# Patient Record
Sex: Female | Born: 1975 | Race: Black or African American | Hispanic: No | Marital: Married | Smoking: Never smoker
Health system: Southern US, Community
[De-identification: ages and names within clinical notes are randomized; demographics above are authoritative.]

## PROBLEM LIST (undated history)

## (undated) DIAGNOSIS — E119 Type 2 diabetes mellitus without complications: Secondary | ICD-10-CM

## (undated) DIAGNOSIS — I10 Essential (primary) hypertension: Secondary | ICD-10-CM

## (undated) HISTORY — PX: OTHER SURGICAL HISTORY: SHX169

## (undated) HISTORY — PX: ROBOT ASSISTED MYOMECTOMY: SHX5142

---

## 1999-01-15 ENCOUNTER — Other Ambulatory Visit: Admission: RE | Admit: 1999-01-15 | Discharge: 1999-01-15 | Payer: Self-pay | Admitting: Obstetrics

## 1999-11-22 ENCOUNTER — Other Ambulatory Visit: Admission: RE | Admit: 1999-11-22 | Discharge: 1999-11-22 | Payer: Self-pay | Admitting: Obstetrics

## 2017-09-08 ENCOUNTER — Other Ambulatory Visit: Payer: Self-pay

## 2017-09-08 ENCOUNTER — Encounter (HOSPITAL_BASED_OUTPATIENT_CLINIC_OR_DEPARTMENT_OTHER): Payer: Self-pay | Admitting: Emergency Medicine

## 2017-09-08 ENCOUNTER — Encounter (HOSPITAL_COMMUNITY): Payer: Self-pay

## 2017-09-08 ENCOUNTER — Emergency Department (HOSPITAL_BASED_OUTPATIENT_CLINIC_OR_DEPARTMENT_OTHER)
Admission: EM | Admit: 2017-09-08 | Discharge: 2017-09-08 | Disposition: A | Discharge status: Home / Self Care | Payer: Managed Care, Other (non HMO) | Attending: Emergency Medicine | Admitting: Emergency Medicine

## 2017-09-08 ENCOUNTER — Emergency Department (HOSPITAL_BASED_OUTPATIENT_CLINIC_OR_DEPARTMENT_OTHER): Payer: Managed Care, Other (non HMO)

## 2017-09-08 ENCOUNTER — Emergency Department (HOSPITAL_COMMUNITY)
Admission: EM | Admit: 2017-09-08 | Discharge: 2017-09-09 | Disposition: A | Discharge status: Home / Self Care | Payer: Managed Care, Other (non HMO) | Source: Home / Self Care | Attending: Emergency Medicine | Admitting: Emergency Medicine

## 2017-09-08 DIAGNOSIS — E876 Hypokalemia: Secondary | ICD-10-CM | POA: Insufficient documentation

## 2017-09-08 DIAGNOSIS — K529 Noninfective gastroenteritis and colitis, unspecified: Secondary | ICD-10-CM

## 2017-09-08 DIAGNOSIS — R1013 Epigastric pain: Secondary | ICD-10-CM | POA: Diagnosis present

## 2017-09-08 DIAGNOSIS — E119 Type 2 diabetes mellitus without complications: Secondary | ICD-10-CM | POA: Insufficient documentation

## 2017-09-08 DIAGNOSIS — R1084 Generalized abdominal pain: Secondary | ICD-10-CM

## 2017-09-08 DIAGNOSIS — Z79899 Other long term (current) drug therapy: Secondary | ICD-10-CM

## 2017-09-08 DIAGNOSIS — I10 Essential (primary) hypertension: Secondary | ICD-10-CM | POA: Diagnosis not present

## 2017-09-08 DIAGNOSIS — R109 Unspecified abdominal pain: Secondary | ICD-10-CM

## 2017-09-08 HISTORY — DX: Essential (primary) hypertension: I10

## 2017-09-08 HISTORY — DX: Type 2 diabetes mellitus without complications: E11.9

## 2017-09-08 LAB — PREGNANCY, URINE: Preg Test, Ur: NEGATIVE

## 2017-09-08 LAB — CBG MONITORING, ED: GLUCOSE-CAPILLARY: 156 mg/dL — AB (ref 70–99)

## 2017-09-08 LAB — COMPREHENSIVE METABOLIC PANEL
ALBUMIN: 4.4 g/dL (ref 3.5–5.0)
ALK PHOS: 62 U/L (ref 38–126)
ALT: 13 U/L (ref 0–44)
ALT: 13 U/L (ref 0–44)
AST: 14 U/L — AB (ref 15–41)
AST: 13 U/L — ABNORMAL LOW (ref 15–41)
Albumin: 4.1 g/dL (ref 3.5–5.0)
Alkaline Phosphatase: 59 U/L (ref 38–126)
Anion gap: 8 (ref 5–15)
Anion gap: 10 (ref 5–15)
BILIRUBIN TOTAL: 0.6 mg/dL (ref 0.3–1.2)
BUN: 11 mg/dL (ref 6–20)
BUN: 8 mg/dL (ref 6–20)
CALCIUM: 8.6 mg/dL — AB (ref 8.9–10.3)
CHLORIDE: 102 mmol/L (ref 98–111)
CO2: 26 mmol/L (ref 22–32)
CO2: 24 mmol/L (ref 22–32)
CREATININE: 0.55 mg/dL (ref 0.44–1.00)
Calcium: 8.9 mg/dL (ref 8.9–10.3)
Chloride: 101 mmol/L (ref 98–111)
Creatinine, Ser: 0.54 mg/dL (ref 0.44–1.00)
GFR calc Af Amer: >60 mL/min (ref >60)
GLUCOSE: 256 mg/dL — AB (ref 70–99)
Glucose, Bld: 148 mg/dL — ABNORMAL HIGH (ref 70–99)
POTASSIUM: 3.1 mmol/L — AB (ref 3.5–5.1)
Potassium: 2.5 mmol/L — CL (ref 3.5–5.1)
Sodium: 135 mmol/L (ref 135–145)
Sodium: 136 mmol/L (ref 135–145)
TOTAL PROTEIN: 7.9 g/dL (ref 6.5–8.1)
Total Bilirubin: 0.8 mg/dL (ref 0.3–1.2)
Total Protein: 7.3 g/dL (ref 6.5–8.1)

## 2017-09-08 LAB — CBC
HEMATOCRIT: 45.5 % (ref 36.0–46.0)
Hemoglobin: 15.3 g/dL — ABNORMAL HIGH (ref 12.0–15.0)
MCH: 27.6 pg (ref 26.0–34.0)
MCHC: 33.6 g/dL (ref 30.0–36.0)
MCV: 82 fL (ref 78.0–100.0)
PLATELETS: 302 10*3/uL (ref 150–400)
RBC: 5.55 MIL/uL — AB (ref 3.87–5.11)
RDW: 12.8 % (ref 11.5–15.5)
WBC: 10.3 10*3/uL (ref 4.0–10.5)

## 2017-09-08 LAB — CBC WITH DIFFERENTIAL/PLATELET
BASOS ABS: 0 10*3/uL (ref 0.0–0.1)
BASOS PCT: 0 %
EOS PCT: 2 %
Eosinophils Absolute: 0.1 10*3/uL (ref 0.0–0.7)
HCT: 42.3 % (ref 36.0–46.0)
Hemoglobin: 15.2 g/dL — ABNORMAL HIGH (ref 12.0–15.0)
Lymphocytes Relative: 45 %
Lymphs Abs: 2.8 10*3/uL (ref 0.7–4.0)
MCH: 28.1 pg (ref 26.0–34.0)
MCHC: 35.9 g/dL (ref 30.0–36.0)
MCV: 78.3 fL (ref 78.0–100.0)
Monocytes Absolute: 0.5 10*3/uL (ref 0.1–1.0)
Monocytes Relative: 8 %
Neutro Abs: 2.8 10*3/uL (ref 1.7–7.7)
Neutrophils Relative %: 45 %
PLATELETS: 266 10*3/uL (ref 150–400)
RBC: 5.4 MIL/uL — ABNORMAL HIGH (ref 3.87–5.11)
RDW: 12.9 % (ref 11.5–15.5)
WBC: 6.2 10*3/uL (ref 4.0–10.5)

## 2017-09-08 LAB — URINALYSIS, ROUTINE W REFLEX MICROSCOPIC
Bilirubin Urine: NEGATIVE
GLUCOSE, UA: 250 mg/dL — AB
HGB URINE DIPSTICK: NEGATIVE
Ketones, ur: NEGATIVE mg/dL
LEUKOCYTES UA: NEGATIVE
Nitrite: NEGATIVE
PH: 7.5 (ref 5.0–8.0)
Protein, ur: NEGATIVE mg/dL
Specific Gravity, Urine: 1.015 (ref 1.005–1.030)

## 2017-09-08 LAB — TROPONIN I

## 2017-09-08 LAB — LIPASE, BLOOD
LIPASE: 31 U/L (ref 11–51)
LIPASE: 29 U/L (ref 11–51)

## 2017-09-08 LAB — I-STAT BETA HCG BLOOD, ED (MC, WL, AP ONLY): I-stat hCG, quantitative: <5 m[IU]/mL (ref <5)

## 2017-09-08 MED ORDER — FENTANYL CITRATE (PF) 100 MCG/2ML IJ SOLN
50.0000 ug | Freq: Once | INTRAMUSCULAR | Status: AC
  Administered 2017-09-08: 50 ug via INTRAVENOUS
  Filled 2017-09-08: qty 2

## 2017-09-08 MED ORDER — IOPAMIDOL (ISOVUE-300) INJECTION 61%
100.0000 mL | Freq: Once | INTRAVENOUS | Status: AC | PRN
  Administered 2017-09-08: 100 mL via INTRAVENOUS

## 2017-09-08 MED ORDER — MAGNESIUM SULFATE 2 GM/50ML IV SOLN
2.0000 g | Freq: Once | INTRAVENOUS | Status: AC
  Administered 2017-09-08: 2 g via INTRAVENOUS
  Filled 2017-09-08: qty 50

## 2017-09-08 MED ORDER — FENTANYL CITRATE (PF) 100 MCG/2ML IJ SOLN
100.0000 ug | INTRAMUSCULAR | Status: DC | PRN
  Administered 2017-09-08 (×3): 100 ug via INTRAVENOUS
  Filled 2017-09-08 (×3): qty 2

## 2017-09-08 MED ORDER — HYDROCODONE-ACETAMINOPHEN 5-325 MG PO TABS: 1.0000 | ORAL_TABLET | Freq: Four times a day (QID) | ORAL | 0 refills | Status: AC | PRN

## 2017-09-08 MED ORDER — HYDROCODONE-ACETAMINOPHEN 5-325 MG PO TABS
1.0000 | ORAL_TABLET | Freq: Once | ORAL | Status: AC
  Administered 2017-09-08: 1 via ORAL
  Filled 2017-09-08: qty 1

## 2017-09-08 MED ORDER — POTASSIUM CHLORIDE 10 MEQ/100ML IV SOLN
10.0000 meq | Freq: Once | INTRAVENOUS | Status: AC
  Administered 2017-09-08: 10 meq via INTRAVENOUS
  Filled 2017-09-08: qty 100

## 2017-09-08 MED ORDER — ONDANSETRON HCL 4 MG/2ML IJ SOLN
4.0000 mg | Freq: Once | INTRAMUSCULAR | Status: AC
  Administered 2017-09-08: 4 mg via INTRAVENOUS
  Filled 2017-09-08: qty 2

## 2017-09-08 MED ORDER — FENTANYL CITRATE (PF) 100 MCG/2ML IJ SOLN
100.0000 ug | Freq: Once | INTRAMUSCULAR | Status: AC
  Administered 2017-09-08: 100 ug via INTRAVENOUS
  Filled 2017-09-08: qty 2

## 2017-09-08 MED ORDER — POTASSIUM CHLORIDE CRYS ER 20 MEQ PO TBCR
40.0000 meq | EXTENDED_RELEASE_TABLET | Freq: Once | ORAL | Status: AC
  Administered 2017-09-08: 40 meq via ORAL
  Filled 2017-09-08: qty 2

## 2017-09-08 MED FILL — HYDROCODON-APAP 5-325: 5-325 | 2 days supply | Qty: 5 | Fill #0

## 2017-09-08 NOTE — ED Triage Notes (Signed)
Pt c/o upper abd pain that began last night with n/v ; pt states that she was seen a the urgent care last night and was diagnosed with enteritis; pt denies any diarrhea or fevers ; LBM was 5 days ago and states  She feels " swollen "

## 2017-09-08 NOTE — ED Notes (Signed)
ED Provider at bedside. 

## 2017-09-08 NOTE — ED Notes (Signed)
Patient transported to CT 

## 2017-09-08 NOTE — ED Provider Notes (Signed)
7:15 AM Care assumed from Dr. Bebe ShaggyWickline.  At time of transfer care, patient has been worked up for abdominal pain with nausea.  Patient was found to have enteritis that is a likely source of her symptoms.  Patient was also found to have hypokalemia with a potassium of 2.5.  Patient received 40 mg p.o. and is awaiting a second 10 mg through IV to finish infusing.  Plan of care is to discharge the patient with a prescription for pain medication after the potassium is finished infusing.  Patient will follow-up with her PCP for further management.  Anticipate reassessment after medications.  Patient was reassessed after her medication ministrations and was feeling somewhat better.  She was given 1 oral pain pill prior to discharge.  Patient will follow-up with her PCP for recheck of her potassium and for further management.  Patient understood return precautions and outpatient instructions.  Patient had no other questions or concerns and was discharged in good condition.   Clinical Impression: 1. Enteritis   2. Hypokalemia   3. Generalized abdominal pain     Disposition: Discharge  Condition: Good  I have discussed the results, Dx and Tx plan with the pt(& family if present). He/she/they expressed understanding and agree(s) with the plan. Discharge instructions discussed at great length. Strict return precautions discussed and pt &/or family have verbalized understanding of the instructions. No further questions at time of discharge.    New Prescriptions   HYDROCODONE-ACETAMINOPHEN (NORCO/VICODIN) 5-325 MG TABLET    Take 1 tablet by mouth every 6 (six) hours as needed for severe pain.    Follow Up: Caffie DammeSmith, Karla, MD [address] [place] [id number] [phone]     [facility] [address] [id number] [id number] [place] [id number] [phone]       Tegeler, Canary Brimhristopher J, MD 09/08/17 [phone]

## 2017-09-08 NOTE — ED Notes (Signed)
Critical result received potassium 2.5; Dr Bebe ShaggyWickline notified.

## 2017-09-08 NOTE — ED Provider Notes (Signed)
[facility] EM[facility]GENCY DEPARTMENT Provider Note   CSN: [id number] Arrival date & time: 09/08/17  0239     History   Chief Complaint Chief Complaint  Patient presents with  . Abdominal Pain    HPI Deborah Dyer is a 42 y.o. female.  The history is provided by the patient and the spouse.  Abdominal Pain   This is a recurrent problem. The current episode started 1 to 2 hours ago. The problem occurs constantly. The problem has been rapidly worsening. The pain is associated with an unknown factor. The pain is located in the epigastric region. The pain is severe. Associated symptoms include nausea. Pertinent negatives include fever, diarrhea, hematochezia, melena, vomiting, constipation and dysuria. The symptoms are aggravated by palpation. Nothing relieves the symptoms.  Patient with history of diabetes and hypertension presents with abdominal pain.  Husband reports she is had a long history of abdominal pain, but tonight her pain became abruptly worse.  She reports recent evaluation by her primary doctor including abdominal ultrasound which she is unsure what the results indicated.  She has no known history of biliary issues. She denies any NSAID use She has never had pain to this level before  Past Medical History:  Diagnosis Date  . Diabetes mellitus without complication (HCC)   . Hypertension     There are no active problems to display for this patient.   Past Surgical History:  Procedure Laterality Date  . c secction     . ROBOT ASSISTED MYOMECTOMY       OB History   None      Home Medications    Prior to Admission medications   Medication Sig Start Date End Date Taking? Authorizing Provider  amLODipine (NORVASC) 10 MG tablet Take 10 mg by mouth daily.   Yes [provider]  losartan (COZAAR) 25 MG tablet Take 25 mg by mouth daily.   Yes [provider]    Family History Family History  Problem Relation Age of Onset  . Diabetes Other      Social History Social History   Tobacco Use  . Smoking status: Never Smoker  . Smokeless tobacco: Never Used  Substance Use Topics  . Alcohol use: Never    Frequency: Never  . Drug use: Never     Allergies   Patient has no known allergies.   Review of Systems Review of Systems  Constitutional: Negative for fever.  Cardiovascular: Negative for chest pain.  Gastrointestinal: Positive for abdominal pain and nausea. Negative for constipation, diarrhea, hematochezia, melena and vomiting.  Genitourinary: Negative for dysuria.  All other systems reviewed and are negative.    Physical Exam Updated Vital Signs BP (!) 140/91 (BP Location: Right Arm)   Pulse 72   Temp 97.8 F (36.6 C) (Oral)   Resp 16   SpO2 99%   Physical Exam CONSTITUTIONAL: Well developed/well nourished, uncomfortable appearing, moaning and writhing around in bed HEAD: Normocephalic/atraumatic EYES: EOMI/PERRL, no icterus ENMT: Mucous membranes moist NECK: supple no meningeal signs SPINE/BACK:entire spine nontender CV: S1/S2 noted, no murmurs/rubs/gallops noted LUNGS: Lungs are clear to auscultation bilaterally, no apparent distress ABDOMEN: soft, moderate right upper quadrant/epigastric/left upper quadrant tenderness, no rebound or guarding, bowel sounds noted throughout abdomen There is no lower abdominal tenderness GU:no cva tenderness NEURO: Pt is awake/alert/appropriate, moves all extremitiesx4.  No facial droop.   EXTREMITIES: pulses normal/equal, full ROM SKIN: warm, color normal PSYCH: Anxious and moaning in the bed ED Treatments / Results  Labs (  all labs ordered are listed, but only abnormal results are displayed) Labs Reviewed  URINALYSIS, ROUTINE W REFLEX MICROSCOPIC - Abnormal; Notable for the following components:      Result Value   Glucose, UA 250 (*)    All other components within normal limits  COMPREHENSIVE METABOLIC PANEL - Abnormal; Notable for the following components:    Potassium 2.5 (*)    Glucose, Bld 256 (*)    Calcium 8.6 (*)    AST 14 (*)    All other components within normal limits  CBC WITH DIFFERENTIAL/PLATELET - Abnormal; Notable for the following components:   RBC 5.40 (*)    Hemoglobin 15.2 (*)    All other components within normal limits  PREGNANCY, URINE  LIPASE, BLOOD  TROPONIN I    EKG EKG Interpretation  Date/Time:  Friday September 08 2017 02:46:25 EDT Ventricular Rate:  75 PR Interval:    QRS Duration: 94 QT Interval:  401 QTC Calculation: 448 R Axis:   51 Text Interpretation:  Sinus rhythm Borderline T abnormalities, anterior leads No previous ECGs available Confirmed by Zadie RhineWickline, Sherril Heyward ([id number]) on 09/08/2017 2:51:39 AM   Radiology Ct Abdomen Pelvis W Contrast  Result Date: 09/08/2017 CLINICAL DATA:  42 year old female with severe abdominal pain today. EXAM: CT ABDOMEN AND PELVIS WITH CONTRAST TECHNIQUE: Multidetector CT imaging of the abdomen and pelvis was performed using the standard protocol following bolus administration of intravenous contrast. CONTRAST:  100mL ISOVUE-300 IOPAMIDOL (ISOVUE-300) INJECTION 61% COMPARISON:  None. FINDINGS: Lower chest: UPPER limits normal heart size noted. Hepatobiliary: The liver and gallbladder are unremarkable. No biliary dilatation. Pancreas: Unremarkable Spleen: Unremarkable Adrenals/Urinary Tract: The kidneys are unremarkable except for small probable renal cysts. A 1.8 cm LEFT adrenal adenoma is noted. The RIGHT adrenal gland and bladder are unremarkable. Stomach/Bowel: There is mild circumferential wall thickening of multiple small bowel loops within the LOWER abdomen and pelvis with mesenteric edema, likely representing enteritis. There is no evidence of bowel obstruction or other definite bowel wall thickening. The appendix is normal. Vascular/Lymphatic: No significant vascular findings are present. No enlarged abdominal or pelvic lymph nodes. Reproductive: An IUD is present. A 3.2 cm benign  appearing RIGHT ovarian cyst is present. The LEFT adnexal region is unremarkable. Other: A trace amount of ascites within the abdomen and pelvis noted. No abscess or pneumoperitoneum. Musculoskeletal: No acute or suspicious bony abnormality. IMPRESSION: 1. Mild circumferential wall thickening of multiple small bowel loops within the LOWER abdomen and pelvis with associated mesenteric edema and trace amount of ascites, likely representing an enteritis. No bowel obstruction, pneumoperitoneum or abscess. Electronically Signed   By: Harmon PierJeffrey  Hu M.D.   On: 09/08/2017 06:15    Procedures Procedures  Medications Ordered in ED Medications  fentaNYL (SUBLIMAZE) injection 100 mcg (100 mcg Intravenous Given 09/08/17 0652)  potassium chloride 10 mEq in 100 mL IVPB (10 mEq Intravenous New Bag/Given 09/08/17 0651)  fentaNYL (SUBLIMAZE) injection 100 mcg (100 mcg Intravenous Given 09/08/17 0320)  ondansetron (ZOFRAN) injection 4 mg (4 mg Intravenous Given 09/08/17 0320)  fentaNYL (SUBLIMAZE) injection 50 mcg (50 mcg Intravenous Given 09/08/17 0405)  potassium chloride 10 mEq in 100 mL IVPB (0 mEq Intravenous Stopped 09/08/17 0650)  magnesium sulfate IVPB 2 g 50 mL (0 g Intravenous Stopped 09/08/17 0519)  iopamidol (ISOVUE-300) 61 % injection 100 mL (100 mLs Intravenous Contrast Given 09/08/17 0535)  potassium chloride SA (K-DUR,KLOR-CON) CR tablet 40 mEq (40 mEq Oral Given 09/08/17 0653)     Initial Impression / Assessment and  Plan / ED Course  I have reviewed the triage vital signs and the nursing notes.  Pertinent labs results that were available during my care of the patient were reviewed by me and considered in my medical decision making (see chart for details). Narcotic database reviewed and considered in decision making    3:17 AM Patient presents with abdominal pain.  Husband reports she has had abdominal pain on and off for a while, but it is unclear how extensive her work-up has been.  Patient is unable  to provide any significant detail.  She reports she recently did have an ultrasound but is unaware of the results, and I do not have access to these results Labs pending at this time 3:41 AM Patient appears to be improved.  Declines further pain medicines at this time.  Labs are pending 4:50 AM Patient still with significant abdominal pain and focal tenderness.  We will proceed with CT imaging 4:56 AM Patient with significant hypokalemia, questionable U waves on EKG. While awaiting CT imaging, IV potassium and magnesium will be infused 6:54 AM CT reveals mild enteritis.  No other acute findings. Patient still has pain, but does feel somewhat improved.  She does report that she usually has diarrhea, but none in the past 48 hours.  This could have contributed to her hypokalemia. I Offered admission, but patient declines. Plan to replenish potassium here in the emergency department, at that point to be discharged.  Signed out to dr tegeler to reassess after potassium infusion  Final Clinical Impressions(s) / ED Diagnoses   Final diagnoses:  Enteritis  Hypokalemia    ED Discharge Orders        Ordered    HYDROcodone-acetaminophen (NORCO/VICODIN) 5-325 MG tablet  Every 6 hours PRN     09/08/17 0650       Zadie Rhine, MD 09/08/17 [phone]

## 2017-09-08 NOTE — ED Notes (Signed)
Checked CBG at patient's family's request. CBG 156 at this time

## 2017-09-08 NOTE — ED Notes (Signed)
Returned from CT.

## 2017-09-08 NOTE — ED Triage Notes (Signed)
Pt is c/oupper abd pain/epigastric pain that started just before she went to bed  Pt describes pain as sharp in nature  Denies N/V/D

## 2017-09-08 NOTE — Discharge Instructions (Addendum)

## 2017-09-08 NOTE — ED Notes (Signed)
Warm blanket given

## 2017-09-09 ENCOUNTER — Emergency Department (HOSPITAL_COMMUNITY): Payer: Managed Care, Other (non HMO)

## 2017-09-09 LAB — URINALYSIS, ROUTINE W REFLEX MICROSCOPIC
Bilirubin Urine: NEGATIVE
GLUCOSE, UA: 150 mg/dL — AB
Ketones, ur: 80 mg/dL — AB
LEUKOCYTES UA: NEGATIVE
NITRITE: NEGATIVE
PH: 6 (ref 5.0–8.0)
Protein, ur: 30 mg/dL — AB
Specific Gravity, Urine: 1.026 (ref 1.005–1.030)

## 2017-09-09 MED ORDER — GI COCKTAIL ~~LOC~~
30.0000 mL | Freq: Once | ORAL | Status: AC
  Administered 2017-09-09: 30 mL via ORAL
  Filled 2017-09-09: qty 30

## 2017-09-09 MED ORDER — ONDANSETRON 4 MG PO TBDP: 4.0000 mg | ORAL_TABLET | Freq: Three times a day (TID) | ORAL | 0 refills | Status: AC | PRN

## 2017-09-09 MED ORDER — CIPROFLOXACIN HCL 500 MG PO TABS
500.0000 mg | ORAL_TABLET | Freq: Two times a day (BID) | ORAL | 0 refills | Status: AC
Start: 2017-09-10 — End: 2017-09-15

## 2017-09-09 MED ORDER — DICYCLOMINE HCL 10 MG/ML IM SOLN
20.0000 mg | Freq: Once | INTRAMUSCULAR | Status: AC
  Administered 2017-09-09: 20 mg via INTRAMUSCULAR
  Filled 2017-09-09: qty 2

## 2017-09-09 MED ORDER — DICYCLOMINE HCL 20 MG PO TABS: 20.0000 mg | ORAL_TABLET | Freq: Three times a day (TID) | ORAL | 0 refills | Status: AC | PRN

## 2017-09-09 MED ORDER — ONDANSETRON HCL 4 MG/2ML IJ SOLN
4.0000 mg | Freq: Once | INTRAMUSCULAR | Status: AC
  Administered 2017-09-09: 4 mg via INTRAVENOUS
  Filled 2017-09-09: qty 2

## 2017-09-09 MED ORDER — SODIUM CHLORIDE 0.9 % IV BOLUS
1000.0000 mL | Freq: Once | INTRAVENOUS | Status: AC
Start: 2017-09-09 — End: 2017-09-09
  Administered 2017-09-09: 1000 mL via INTRAVENOUS

## 2017-09-09 MED ORDER — METOCLOPRAMIDE HCL 5 MG/ML IJ SOLN: 10.0000 mg | Freq: Once | INTRAMUSCULAR | Status: DC

## 2017-09-09 NOTE — Discharge Instructions (Signed)

## 2017-09-09 NOTE — ED Notes (Signed)
Pt able to tolerate ginger ale and crackers however states that it has made her abdominal pain more severe. Will inform Dr. Eudelia Bunchardama.

## 2017-09-09 NOTE — ED Notes (Signed)
Patient transported to X-ray 

## 2017-09-09 NOTE — ED Provider Notes (Signed)
[facility] EMERGENCY DEPARTMENT Provider Note  CSN: [id number] Arrival date & time: 09/08/17 1817  Chief Complaint(s) Abdominal Pain  HPI Deborah Dyer is a 42 y.o. female who presents to the emergency department for epigastric/periumbilical abdominal pain that is been aching/cramping in nature for 2 days.  This is associated with nausea and nonbloody nonbilious emesis.  No diarrhea.  Pain is exacerbated with palpation.  Has not tried eating or hydrating due to pain.  Denies any fevers or chills.  No known suspicious food intake, recent antibiotics.  No recent travel or camping.  Patient was evaluated at [facility] last night with a obtain a CT scan confirming enteritis.  No bowel obstruction.  Patient denies any urinary symptoms.  Presents today for persistent symptoms with nausea and vomiting.  HPI  Past Medical History Past Medical History:  Diagnosis Date  . Diabetes mellitus without complication (HCC)   . Hypertension    There are no active problems to display for this patient.  Home Medication(s) Prior to Admission medications   Medication Sig Start Date End Date Taking? Authorizing Provider  amLODipine (NORVASC) 10 MG tablet Take 10 mg by mouth daily.    [provider]  ciprofloxacin (CIPRO) 500 MG tablet Take 1 tablet (500 mg total) by mouth every 12 (twelve) hours for 5 days. 09/10/17 09/15/17  Nira Conn, MD  dicyclomine (BENTYL) 20 MG tablet Take 1 tablet (20 mg total) by mouth 3 (three) times daily as needed for up to 7 days for spasms. 09/09/17 09/16/17  Nira Conn, MD  HYDROcodone-acetaminophen (NORCO/VICODIN) 5-325 MG tablet Take 1 tablet by mouth every 6 (six) hours as needed for severe pain. 09/08/17   Zadie Rhine, MD  losartan (COZAAR) 25 MG tablet Take 25 mg by mouth daily.    [provider]  ondansetron (ZOFRAN ODT) 4 MG disintegrating tablet Take 1 tablet (4 mg total) by mouth every 8 (eight)  hours as needed for up to 3 days for nausea or vomiting. 09/09/17 09/12/17  Tayja Manzer, Amadeo Garnet, MD                                                                                                                                    Past Surgical History Past Surgical History:  Procedure Laterality Date  . c secction     . ROBOT ASSISTED MYOMECTOMY     Family History Family History  Problem Relation Age of Onset  . Diabetes Other     Social History Social History   Tobacco Use  . Smoking status: Never Smoker  . Smokeless tobacco: Never Used  Substance Use Topics  . Alcohol use: Never    Frequency: Never  . Drug use: Never   Allergies Patient has no known allergies.  Review of Systems Review of Systems All other systems are reviewed and are negative for acute change except as noted in the  HPI  Physical Exam Vital Signs  I have reviewed the triage vital signs BP (!) 159/95   Pulse 97   Temp 98.8 F (37.1 C) (Oral)   Resp (!) 22   Ht 5\' 4"  (1.626 m)   Wt 76.7 kg (169 lb)   SpO2 98%   BMI 29.01 kg/m   Physical Exam  Constitutional: She is oriented to person, place, and time. She appears well-developed and well-nourished. No distress.  HENT:  Head: Normocephalic and atraumatic.  Nose: Nose normal.  Eyes: Pupils are equal, round, and reactive to light. Conjunctivae and EOM are normal. Right eye exhibits no discharge. Left eye exhibits no discharge. No scleral icterus.  Neck: Normal range of motion. Neck supple.  Cardiovascular: Normal rate and regular rhythm. Exam reveals no gallop and no friction rub.  No murmur heard. Pulmonary/Chest: Effort normal and breath sounds normal. No stridor. No respiratory distress. She has no rales.  Abdominal: Soft. She exhibits no distension. There is tenderness in the epigastric area and periumbilical area. There is no rigidity, no rebound, no guarding and no CVA tenderness.  Musculoskeletal: She exhibits no edema or tenderness.    Neurological: She is alert and oriented to person, place, and time.  Skin: Skin is warm and dry. No rash noted. She is not diaphoretic. No erythema.  Psychiatric: She has a normal mood and affect.  Vitals reviewed.   ED Results and Treatments Labs (all labs ordered are listed, but only abnormal results are displayed) Labs Reviewed  COMPREHENSIVE METABOLIC PANEL - Abnormal; Notable for the following components:      Result Value   Potassium 3.1 (*)    Glucose, Bld 148 (*)    AST 13 (*)    All other components within normal limits  CBC - Abnormal; Notable for the following components:   RBC 5.55 (*)    Hemoglobin 15.3 (*)    All other components within normal limits  URINALYSIS, ROUTINE W REFLEX MICROSCOPIC - Abnormal; Notable for the following components:   APPearance HAZY (*)    Glucose, UA 150 (*)    Hgb urine dipstick LARGE (*)    Ketones, ur 80 (*)    Protein, ur 30 (*)    Bacteria, UA RARE (*)    All other components within normal limits  CBG MONITORING, ED - Abnormal; Notable for the following components:   Glucose-Capillary 156 (*)    All other components within normal limits  LIPASE, BLOOD  I-STAT BETA HCG BLOOD, ED ([facility], [facility], [facility] ONLY)  CBG MONITORING, ED                                                                                                                         EKG  EKG Interpretation  Date/Time:    Ventricular Rate:    PR Interval:    QRS Duration:   QT Interval:    QTC Calculation:   R Axis:     Text Interpretation:  Radiology Dg Abd 1 View  Result Date: 09/09/2017 CLINICAL DATA:  Abdominal pain and nausea since 2 a.m. yesterday. EXAM: ABDOMEN - 1 VIEW COMPARISON:  CT 09/08/2017 FINDINGS: Increased fecal retention in the right colon. Scattered fluid-filled small bowel loops compatible with reported enteritis on CT. No free air. IUD noted in the mid pelvis. No acute osseous abnormality. No radio-opaque calculi or other significant  radiographic abnormality are seen. IMPRESSION: Increased stool burden in the right colon. Fluid-filled mild distention of small bowel in the left hemiabdomen compatible with small bowel enteritis by recent CT. Electronically Signed   By: Tollie Eth M.D.   On: 09/09/2017 02:00   Dg Abdomen 1 View  Result Date: 09/09/2017 CLINICAL DATA:  Abdominal pain EXAM: ABDOMEN - 1 VIEW COMPARISON:  CT abdomen pelvis 09/08/2017 FINDINGS: Edematous appearance of bowel in the left abdomen, compatible with the recent CT findings. No small bowel dilatation. T-shaped contraceptive device overlies the pelvis. IMPRESSION: Edematous appearance of the left sided abdominal small bowel, compatible with the findings of the earlier CT. Electronically Signed   By: Deatra Robinson M.D.   On: 09/09/2017 01:33   Ct Abdomen Pelvis W Contrast  Result Date: 09/08/2017 CLINICAL DATA:  42 year old female with severe abdominal pain today. EXAM: CT ABDOMEN AND PELVIS WITH CONTRAST TECHNIQUE: Multidetector CT imaging of the abdomen and pelvis was performed using the standard protocol following bolus administration of intravenous contrast. CONTRAST:  ISOVUE-300 IOPAMIDOL (ISOVUE-300) INJECTION 61% COMPARISON:  None. FINDINGS: Lower chest: UPPER limits normal heart size noted. Hepatobiliary: The liver and gallbladder are unremarkable. No biliary dilatation. Pancreas: Unremarkable Spleen: Unremarkable Adrenals/Urinary Tract: The kidneys are unremarkable except for small probable renal cysts. A 1.8 cm LEFT adrenal adenoma is noted. The RIGHT adrenal gland and bladder are unremarkable. Stomach/Bowel: There is mild circumferential wall thickening of multiple small bowel loops within the LOWER abdomen and pelvis with mesenteric edema, likely representing enteritis. There is no evidence of bowel obstruction or other definite bowel wall thickening. The appendix is normal. Vascular/Lymphatic: No significant vascular findings are present. No enlarged  abdominal or pelvic lymph nodes. Reproductive: An IUD is present. A 3.2 cm benign appearing RIGHT ovarian cyst is present. The LEFT adnexal region is unremarkable. Other: A trace amount of ascites within the abdomen and pelvis noted. No abscess or pneumoperitoneum. Musculoskeletal: No acute or suspicious bony abnormality. IMPRESSION: 1. Mild circumferential wall thickening of multiple small bowel loops within the LOWER abdomen and pelvis with associated mesenteric edema and trace amount of ascites, likely representing an enteritis. No bowel obstruction, pneumoperitoneum or abscess. Electronically Signed   By: Harmon Pier M.D.   On: 09/08/2017 06:15   Pertinent labs & imaging results that were available during my care of the patient were reviewed by me and considered in my medical decision making (see chart for details).  Medications Ordered in ED Medications  sodium chloride 0.9 % bolus 1,000 mL (0 mLs Intravenous Stopped 09/09/17 0256)  ondansetron (ZOFRAN) injection 4 mg (4 mg Intravenous Given 09/09/17 0127)  dicyclomine (BENTYL) injection 20 mg (20 mg Intramuscular Given 09/09/17 0129)  ondansetron (ZOFRAN) injection 4 mg (4 mg Intravenous Given 09/09/17 0256)  gi cocktail (Maalox,Lidocaine,Donnatal) (30 mLs Oral Given 09/09/17 0257)  Procedures Procedures  (including critical care time)  Medical Decision Making / ED Course I have reviewed the nursing notes for this encounter and the patient's prior records (if available in EHR or on provided paperwork).    Patient with known enteritis on CT scan obtained less than 24 hours ago.  Labs without significant changes.  We did reveal evidence of improve potassium.  No leukocytosis.  No evidence of DKA.  Patient treated symptomatically with antiemetics and IV fluids due to decreased oral tolerance.  Also given GI cocktail and  oral hydration.  KUB without evidence of obstruction.  Patient was able to tolerate oral hydration.  The patient appears reasonably screened and/or stabilized for discharge and I doubt any other medical condition or other [facility] requiring further screening, evaluation, or treatment in the ED at this time prior to discharge.  The patient is safe for discharge with strict return precautions.   Final Clinical Impression(s) / ED Diagnoses Final diagnoses:  Abdominal pain  Enteritis    Disposition: Discharge  Condition: Good  I have discussed the results, Dx and Tx plan with the patient who expressed understanding and agree(s) with the plan. Discharge instructions discussed at great length. The patient was given strict return precautions who verbalized understanding of the instructions. No further questions at time of discharge.    ED Discharge Orders        Ordered    ciprofloxacin (CIPRO) 500 MG tablet  Every 12 hours     09/09/17 0433    dicyclomine (BENTYL) 20 MG tablet  3 times daily PRN     09/09/17 0433    ondansetron (ZOFRAN ODT) 4 MG disintegrating tablet  Every 8 hours PRN     09/09/17 0433       Follow Up: Caffie DammeSmith, Karla, MD [address] [place] [id number] [phone]  Schedule an appointment as soon as possible for a visit  in 5-7 days, If symptoms do not improve or  worsen     This chart was dictated using voice recognition software.  Despite best efforts to proofread,  errors can occur which can change the documentation meaning.   Nira Connardama, Shaivi Rothschild Eduardo, MD 09/09/17 [phone]

## 2019-07-07 DIAGNOSIS — Z0000 Encounter for general adult medical examination without abnormal findings: Secondary | ICD-10-CM | POA: Diagnosis not present

## 2019-07-07 DIAGNOSIS — E1140 Type 2 diabetes mellitus with diabetic neuropathy, unspecified: Secondary | ICD-10-CM | POA: Diagnosis not present

## 2019-07-07 DIAGNOSIS — Z79899 Other long term (current) drug therapy: Secondary | ICD-10-CM | POA: Diagnosis not present

## 2019-07-07 DIAGNOSIS — E559 Vitamin D deficiency, unspecified: Secondary | ICD-10-CM | POA: Diagnosis not present

## 2019-07-07 DIAGNOSIS — Z114 Encounter for screening for human immunodeficiency virus [HIV]: Secondary | ICD-10-CM | POA: Diagnosis not present

## 2019-07-07 DIAGNOSIS — E042 Nontoxic multinodular goiter: Secondary | ICD-10-CM | POA: Diagnosis not present

## 2019-07-07 DIAGNOSIS — D539 Nutritional anemia, unspecified: Secondary | ICD-10-CM | POA: Diagnosis not present

## 2019-07-07 DIAGNOSIS — Z13220 Encounter for screening for lipoid disorders: Secondary | ICD-10-CM | POA: Diagnosis not present

## 2019-07-07 DIAGNOSIS — Z1159 Encounter for screening for other viral diseases: Secondary | ICD-10-CM | POA: Diagnosis not present

## 2019-07-07 DIAGNOSIS — R0602 Shortness of breath: Secondary | ICD-10-CM | POA: Diagnosis not present

## 2019-07-07 DIAGNOSIS — Z131 Encounter for screening for diabetes mellitus: Secondary | ICD-10-CM | POA: Diagnosis not present

## 2019-07-12 IMAGING — CT CT ABD-PELV W/ CM
2 of 5 series · 16 of 46 positions shown, 18 images · IV contrast (iopamidol)
Comparison: None.

CLINICAL DATA: 41-year-old female with severe abdominal pain today.

EXAM:
CT ABDOMEN AND PELVIS WITH CONTRAST
TECHNIQUE: Multidetector CT imaging of the abdomen and pelvis was performed
using the standard protocol following bolus administration of
intravenous contrast.
CONTRAST:  100mL [id number] IOPAMIDOL ([id number]) INJECTION 61%

[Series 2: axial st · axial · 0.78mm/px · z∈[-499,+1]mm · 13 of 112 slices shown, 15 images]
[im 6/112  soft-tissue]
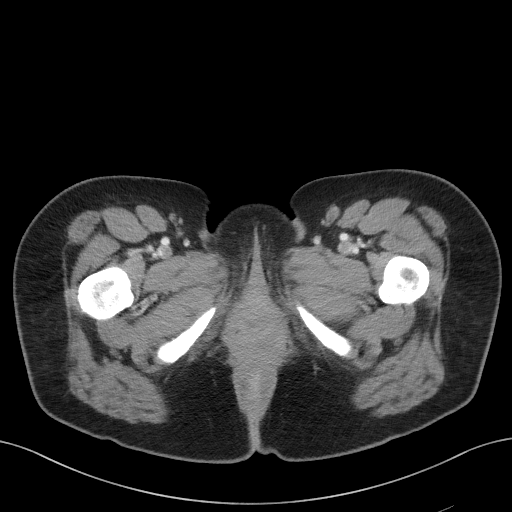
[im 6/112  bone]
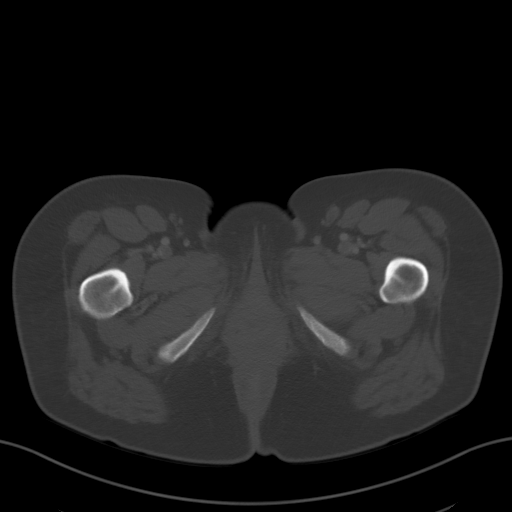
[im 18/112  soft-tissue]
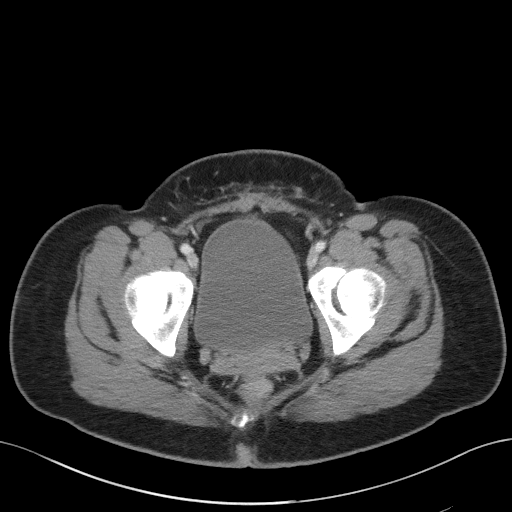
[im 24/112  soft-tissue]
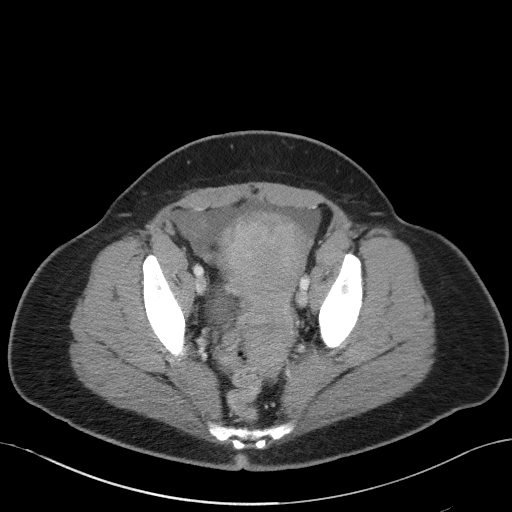
[im 30/112  soft-tissue]
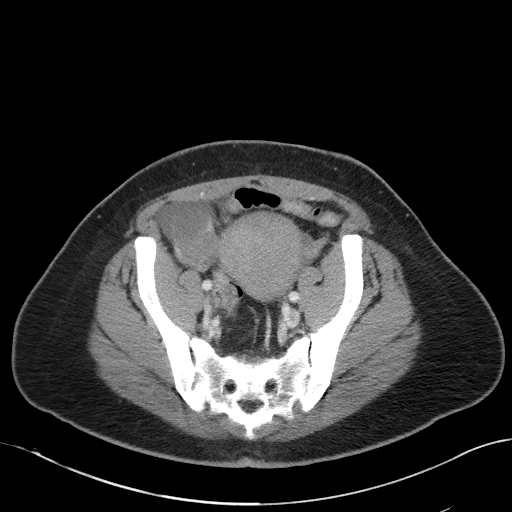
[im 41/112  soft-tissue]
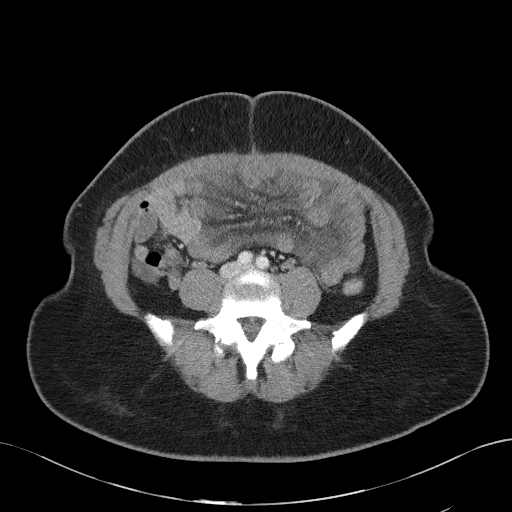
[im 47/112  soft-tissue]
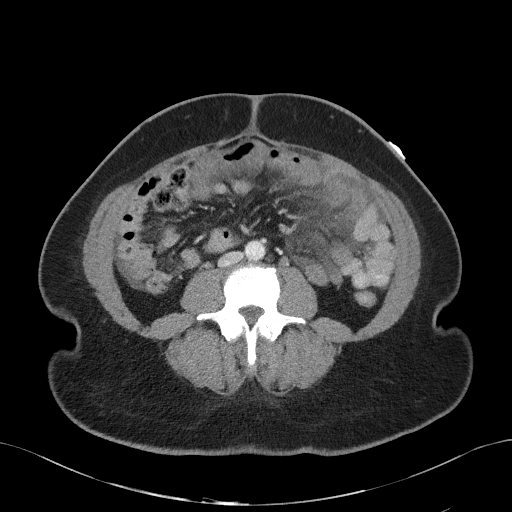
[im 59/112  soft-tissue]
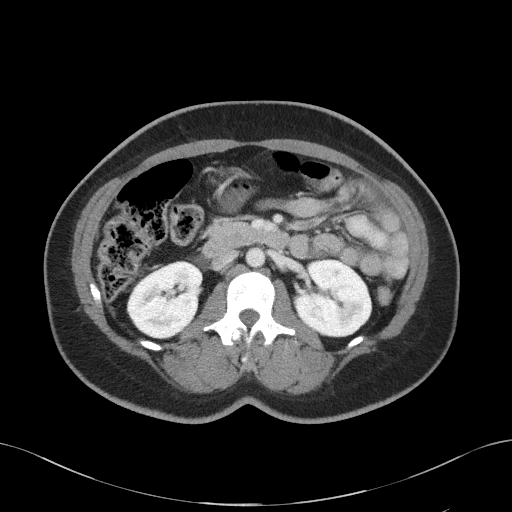
[im 65/112  soft-tissue]
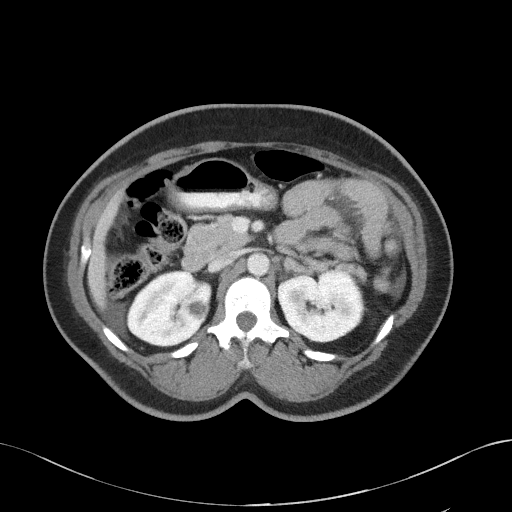
[im 71/112  soft-tissue]
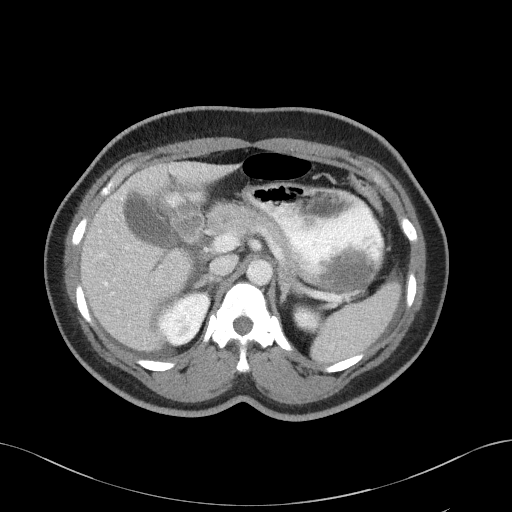
[im 71/112  bone]
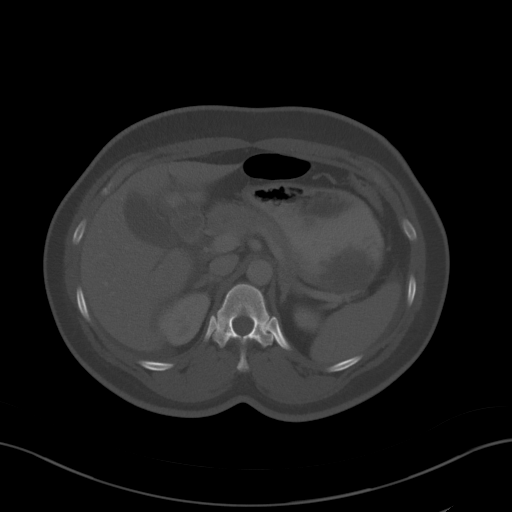
[im 82/112  soft-tissue]
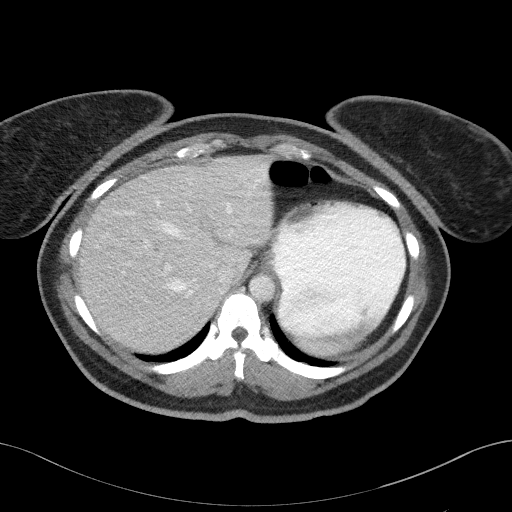
[im 88/112  soft-tissue]
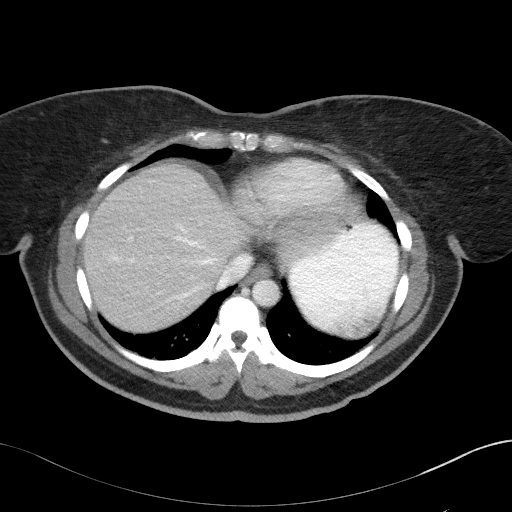
[im 94/112  soft-tissue]
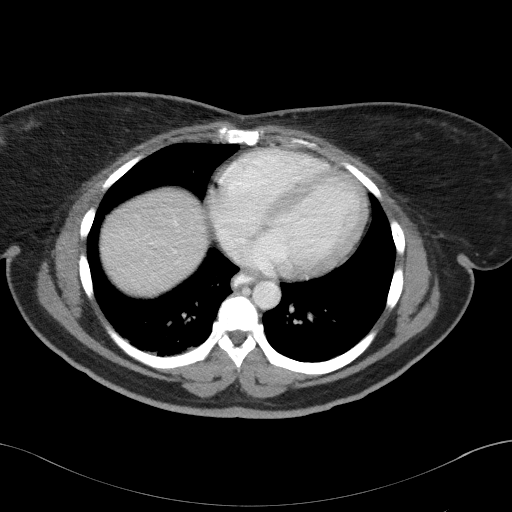
[im 106/112  soft-tissue]
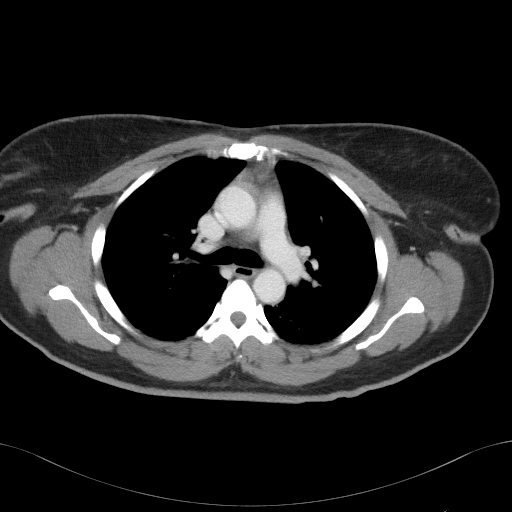

[Series 5: coronal st · coronal · 0.88mm/px · 3 of 75 slices shown]
[im 25/75  soft-tissue]
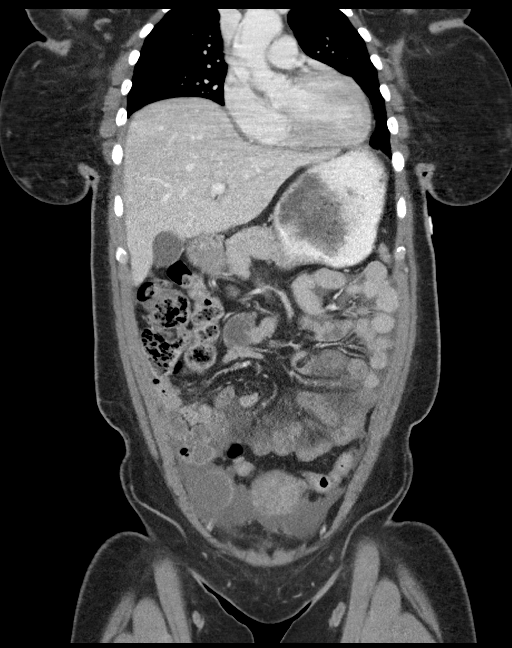
[im 33/75  soft-tissue]
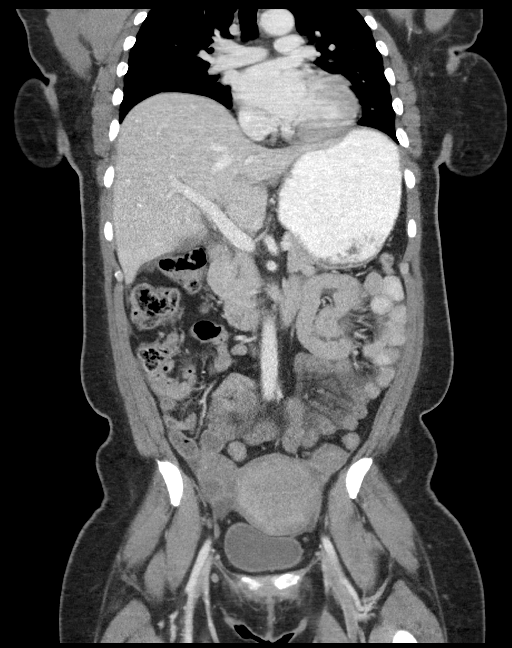
[im 42/75  soft-tissue]
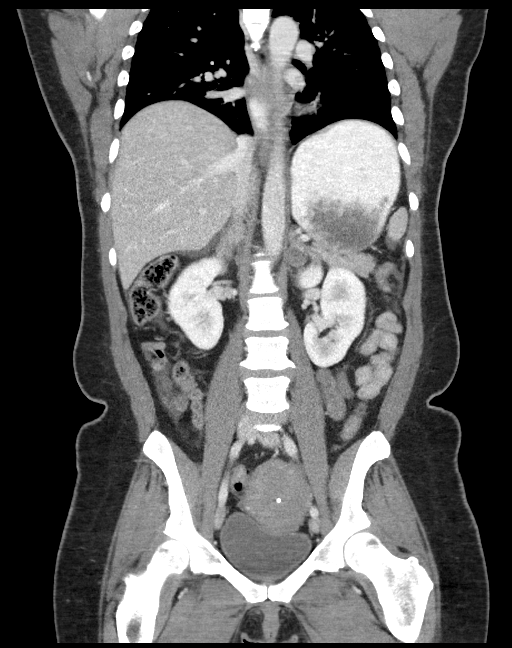

[16 of 46 positions shown; findings below may reference images not displayed]

FINDINGS: Lower chest: UPPER limits normal heart size noted.

Hepatobiliary: The liver and gallbladder are unremarkable. No
biliary dilatation.

Pancreas: Unremarkable

Spleen: Unremarkable

Adrenals/Urinary Tract: The kidneys are unremarkable except for
small probable renal cysts. A 1.8 cm LEFT adrenal adenoma is noted.
The RIGHT adrenal gland and bladder are unremarkable.

Stomach/Bowel: There is mild circumferential wall thickening of
multiple small bowel loops within the LOWER abdomen and pelvis with
mesenteric edema, likely representing enteritis. There is no
evidence of bowel obstruction or other definite bowel wall
thickening. The appendix is normal..

Vascular/Lymphatic: No significant vascular findings are present. No
enlarged abdominal or pelvic lymph nodes.

Reproductive: An IUD is present. A 3.2 cm benign appearing RIGHT
ovarian cyst is present. The LEFT adnexal region is unremarkable.

Other: A trace amount of ascites within the abdomen and pelvis
noted. No abscess or pneumoperitoneum.

Musculoskeletal: No acute or suspicious bony abnormality.
IMPRESSION: 1. Mild circumferential wall thickening of multiple small bowel
loops within the LOWER abdomen and pelvis with associated mesenteric
edema and trace amount of ascites, likely representing an enteritis.
No bowel obstruction, pneumoperitoneum or abscess.

## 2019-07-13 IMAGING — DX DG ABDOMEN 1V
1 series · 1 of 1 positions shown · non-contrast
Comparison: CT abdomen pelvis 09/08/2017

CLINICAL DATA: Abdominal pain

EXAM:
ABDOMEN - 1 VIEW

[abdomen kub]
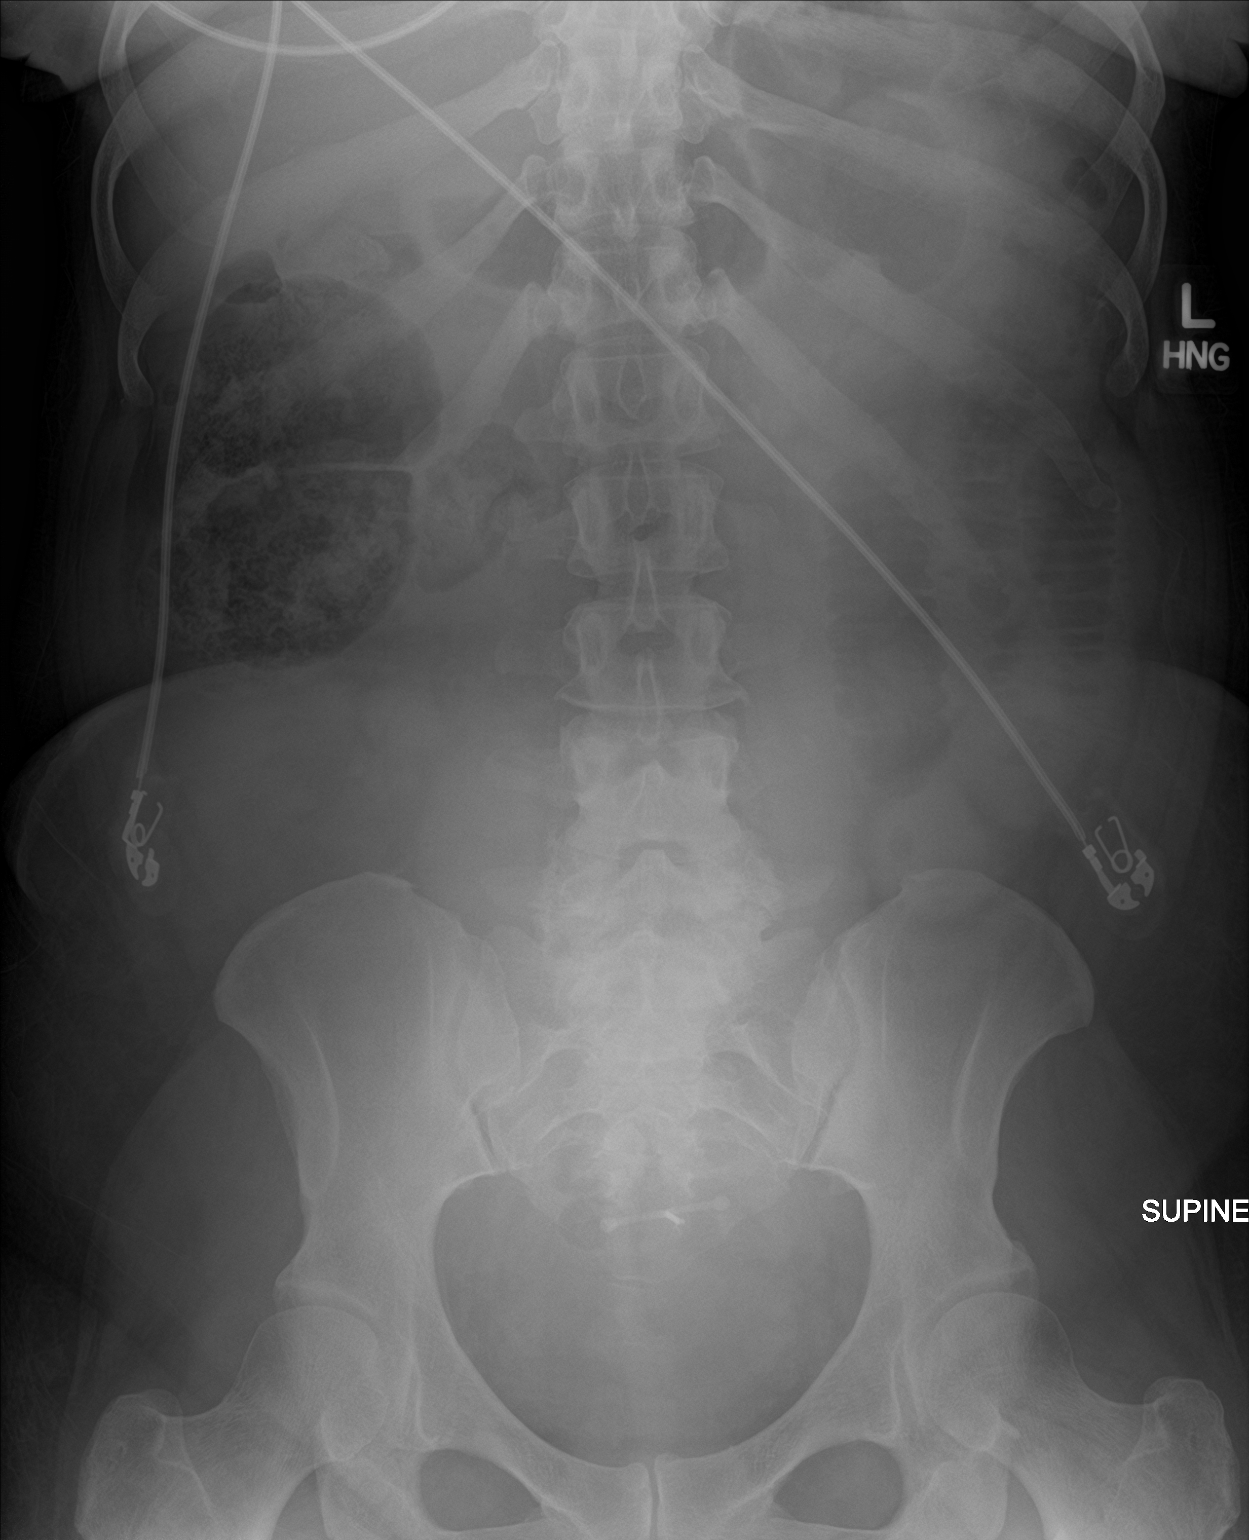

[1 of 1 positions shown; findings below may reference images not displayed]

FINDINGS: Edematous appearance of bowel in the left abdomen, compatible with
the recent CT findings. No small bowel dilatation. T-shaped
contraceptive device overlies the pelvis.
IMPRESSION: Edematous appearance of the left sided abdominal small bowel,
compatible with the findings of the earlier CT.

## 2019-07-13 IMAGING — CR DG ABDOMEN 1V
1 series · 1 of 1 positions shown · non-contrast
Comparison: CT 09/08/2017

CLINICAL DATA: Abdominal pain and nausea since 2 a.m. yesterday.

EXAM:
ABDOMEN - 1 VIEW

[abdomen erect]
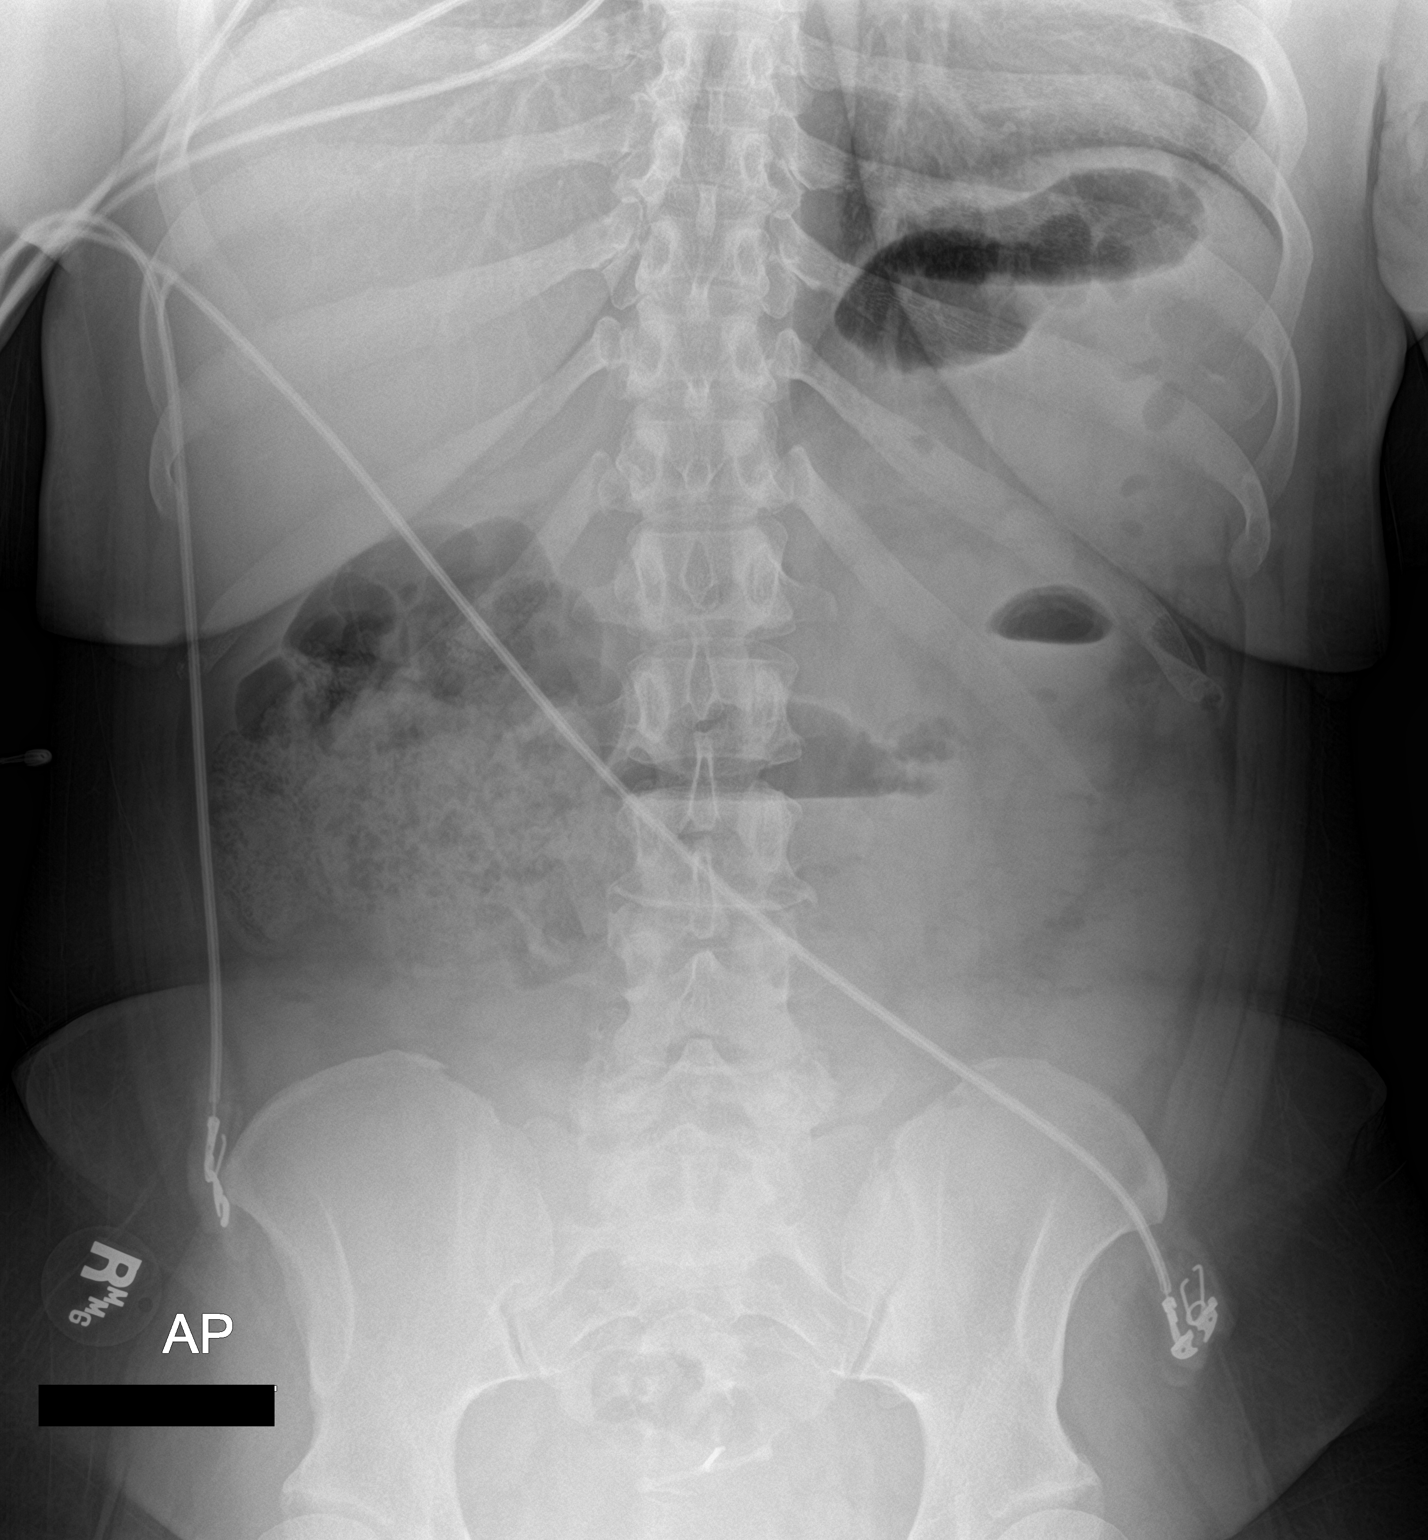

[1 of 1 positions shown; findings below may reference images not displayed]

FINDINGS: Increased fecal retention in the right colon. Scattered fluid-filled
small bowel loops compatible with reported enteritis on CT. No free
air. IUD noted in the mid pelvis. No acute osseous abnormality. No
radio-opaque calculi or other significant radiographic abnormality
are seen.
IMPRESSION: Increased stool burden in the right colon. Fluid-filled mild
distention of small bowel in the left hemiabdomen compatible with
small bowel enteritis by recent CT.

## 2020-01-09 DIAGNOSIS — Z23 Encounter for immunization: Secondary | ICD-10-CM | POA: Diagnosis not present

## 2020-01-22 DIAGNOSIS — Z20822 Contact with and (suspected) exposure to covid-19: Secondary | ICD-10-CM | POA: Diagnosis not present

## 2020-01-22 DIAGNOSIS — R0981 Nasal congestion: Secondary | ICD-10-CM | POA: Diagnosis not present

## 2020-01-22 DIAGNOSIS — R059 Cough, unspecified: Secondary | ICD-10-CM | POA: Diagnosis not present

## 2020-01-22 DIAGNOSIS — R519 Headache, unspecified: Secondary | ICD-10-CM | POA: Diagnosis not present

## 2020-02-11 ENCOUNTER — Other Ambulatory Visit (HOSPITAL_COMMUNITY): Payer: Self-pay | Admitting: Internal Medicine

## 2020-02-11 MED FILL — AMLODIPINE BESYLATE 10 MG T: 10 | 90 days supply | Qty: 90 | Fill #0

## 2020-02-11 MED FILL — GLIPIZIDE ER 5 MG TB24: 5 | 90 days supply | Qty: 90 | Fill #0

## 2020-02-11 MED FILL — LOSARTAN POTASSIUM 100 MG T: 100 | 90 days supply | Qty: 90 | Fill #0

## 2020-07-30 ENCOUNTER — Other Ambulatory Visit (HOSPITAL_COMMUNITY): Payer: Self-pay

## 2020-07-30 MED FILL — Glipizide Tab ER 24HR 5 MG: ORAL | 90 days supply | Qty: 90 | Fill #0 | Status: AC

## 2020-07-30 MED FILL — Losartan Potassium Tab 100 MG: ORAL | 90 days supply | Qty: 90 | Fill #0 | Status: AC

## 2020-07-30 MED FILL — Amlodipine Besylate Tab 10 MG (Base Equivalent): ORAL | 90 days supply | Qty: 90 | Fill #0 | Status: AC

## 2020-08-03 ENCOUNTER — Other Ambulatory Visit (HOSPITAL_COMMUNITY): Payer: Self-pay

## 2020-09-16 DIAGNOSIS — Z01419 Encounter for gynecological examination (general) (routine) without abnormal findings: Secondary | ICD-10-CM | POA: Diagnosis not present

## 2020-10-13 DIAGNOSIS — Z30433 Encounter for removal and reinsertion of intrauterine contraceptive device: Secondary | ICD-10-CM | POA: Diagnosis not present

## 2020-10-13 DIAGNOSIS — Z3202 Encounter for pregnancy test, result negative: Secondary | ICD-10-CM | POA: Diagnosis not present

## 2020-11-24 DIAGNOSIS — Z30431 Encounter for routine checking of intrauterine contraceptive device: Secondary | ICD-10-CM | POA: Diagnosis not present

## 2021-01-07 MED FILL — Glipizide Tab ER 24HR 5 MG: ORAL | 90 days supply | Qty: 90 | Fill #1 | Status: AC

## 2021-01-07 MED FILL — Losartan Potassium Tab 100 MG: ORAL | 90 days supply | Qty: 90 | Fill #1 | Status: AC

## 2021-01-07 MED FILL — Amlodipine Besylate Tab 10 MG (Base Equivalent): ORAL | 90 days supply | Qty: 90 | Fill #1 | Status: AC

## 2021-01-08 ENCOUNTER — Other Ambulatory Visit (HOSPITAL_COMMUNITY): Payer: Self-pay

## 2022-10-07 ENCOUNTER — Other Ambulatory Visit: Payer: Self-pay
